# Patient Record
Sex: Male | Born: 1995 | Race: White | Hispanic: No | Marital: Single | Smoking: Never smoker
Health system: Southern US, Community
[De-identification: ages and names within clinical notes are randomized; demographics above are authoritative.]

## PROBLEM LIST (undated history)

## (undated) DIAGNOSIS — F419 Anxiety disorder, unspecified: Secondary | ICD-10-CM

## (undated) DIAGNOSIS — F840 Autistic disorder: Secondary | ICD-10-CM

---
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
## 2018-04-05 ENCOUNTER — Other Ambulatory Visit: Payer: Self-pay | Admitting: Internal Medicine

## 2018-04-05 DIAGNOSIS — R945 Abnormal results of liver function studies: Principal | ICD-10-CM

## 2018-04-05 DIAGNOSIS — R7989 Other specified abnormal findings of blood chemistry: Secondary | ICD-10-CM

## 2018-04-16 ENCOUNTER — Ambulatory Visit
Admission: RE | Admit: 2018-04-16 | Discharge: 2018-04-16 | Disposition: A | Discharge status: Home / Self Care | Payer: BLUE CROSS/BLUE SHIELD | Source: Ambulatory Visit | Attending: Internal Medicine | Admitting: Internal Medicine

## 2018-04-16 DIAGNOSIS — R7989 Other specified abnormal findings of blood chemistry: Secondary | ICD-10-CM

## 2018-04-16 DIAGNOSIS — R945 Abnormal results of liver function studies: Principal | ICD-10-CM

## 2018-09-13 ENCOUNTER — Other Ambulatory Visit: Payer: Self-pay | Admitting: Internal Medicine

## 2018-09-13 DIAGNOSIS — R7401 Elevation of levels of liver transaminase levels: Secondary | ICD-10-CM

## 2018-10-10 ENCOUNTER — Other Ambulatory Visit: Payer: BLUE CROSS/BLUE SHIELD

## 2018-10-17 ENCOUNTER — Ambulatory Visit
Admission: RE | Admit: 2018-10-17 | Discharge: 2018-10-17 | Disposition: A | Discharge status: Home / Self Care | Payer: BC Managed Care – PPO | Source: Ambulatory Visit | Attending: Internal Medicine | Admitting: Internal Medicine

## 2018-10-17 DIAGNOSIS — R7401 Elevation of levels of liver transaminase levels: Secondary | ICD-10-CM

## 2019-05-10 IMAGING — US ULTRASOUND ABDOMEN LIMITED
1 series · 14 of 25 positions shown · non-contrast
Comparison: None.

CLINICAL DATA: Elevated liver function tests.

EXAM:
ULTRASOUND ABDOMEN LIMITED RIGHT UPPER QUADRANT

[Series 1: ultrasound abdomen limited · 0.28mm/px · 14 of 43 slices shown]
[im 1/43]
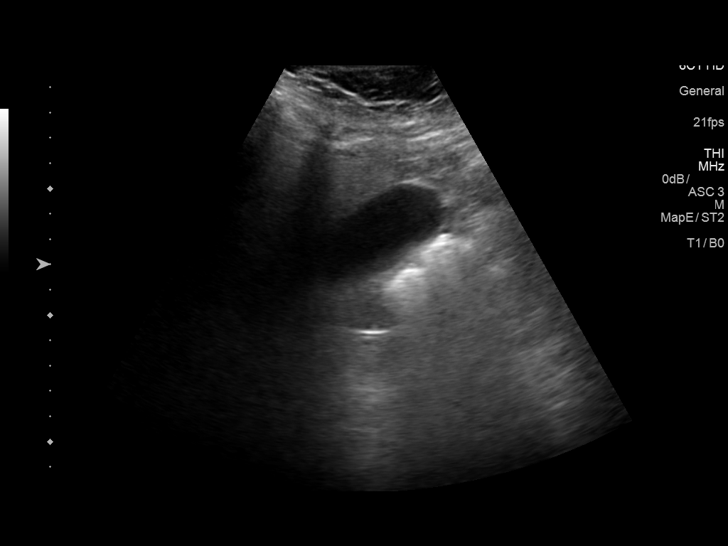
[im 4/43]
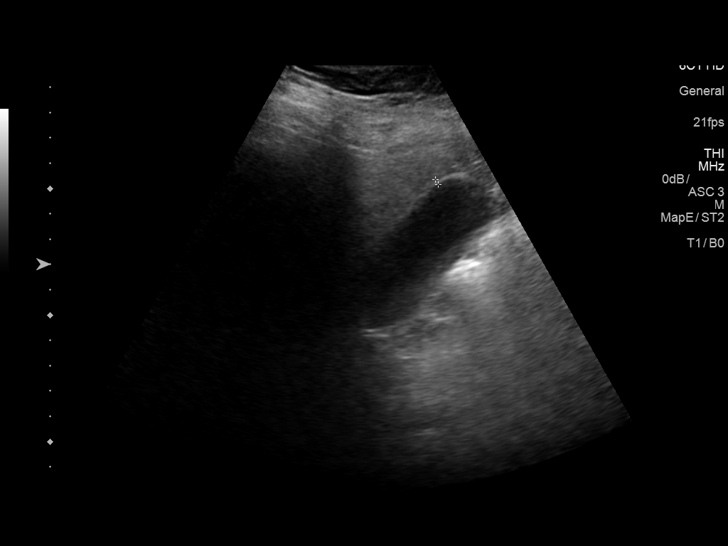
[im 8/43]
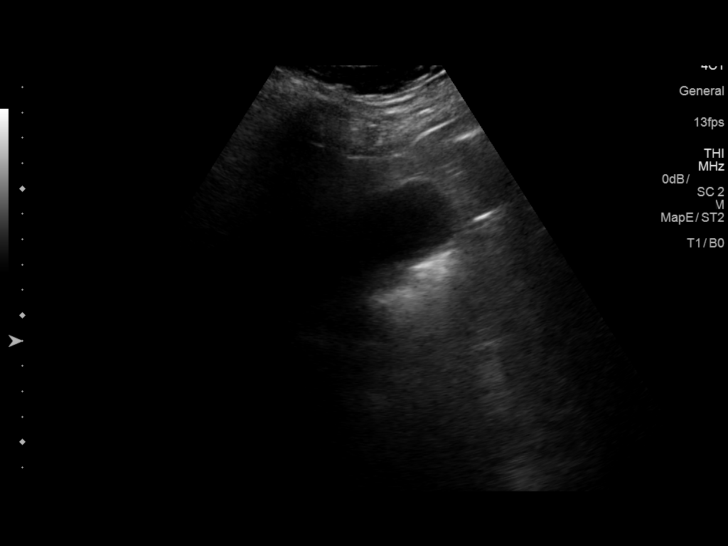
[im 11/43]
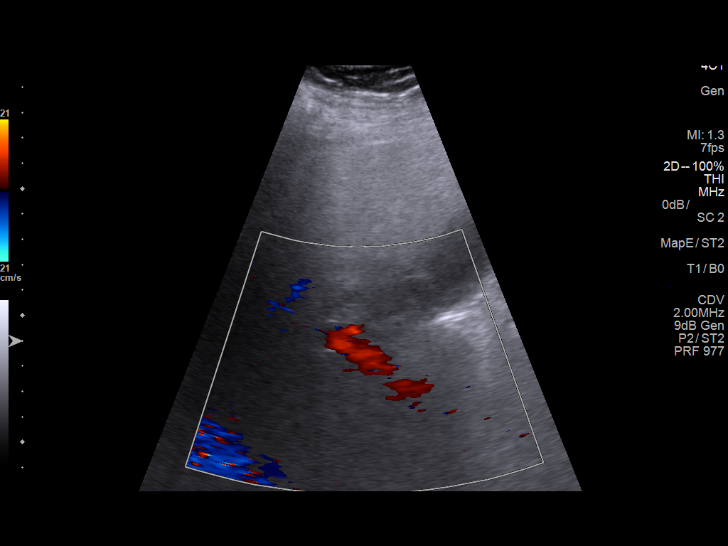
[im 15/43]
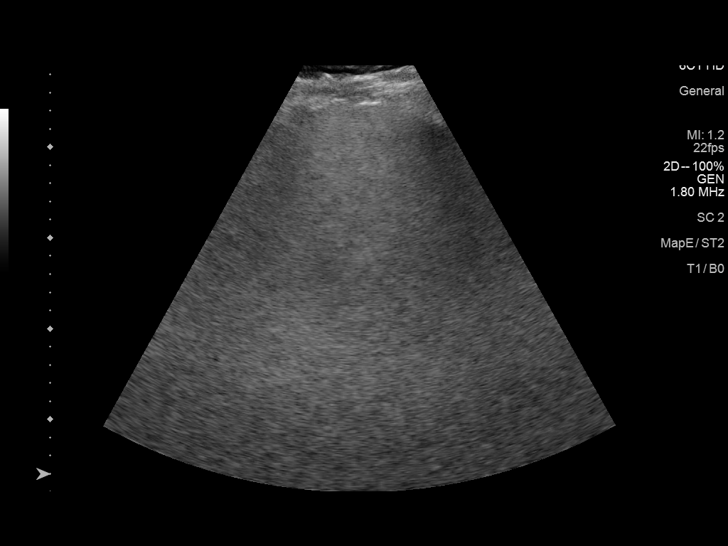
[im 16/43]
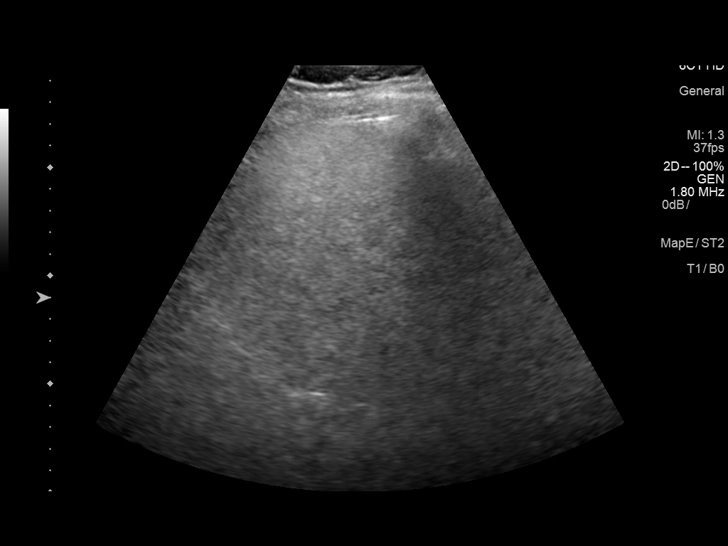
[im 20/43]
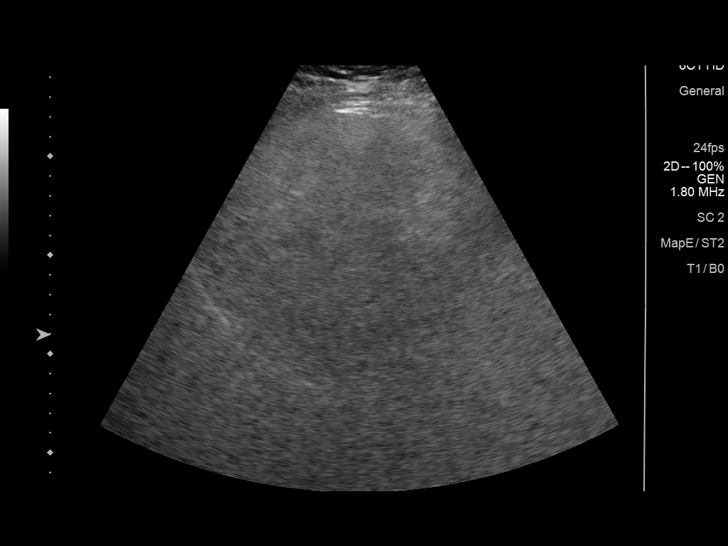
[im 23/43]
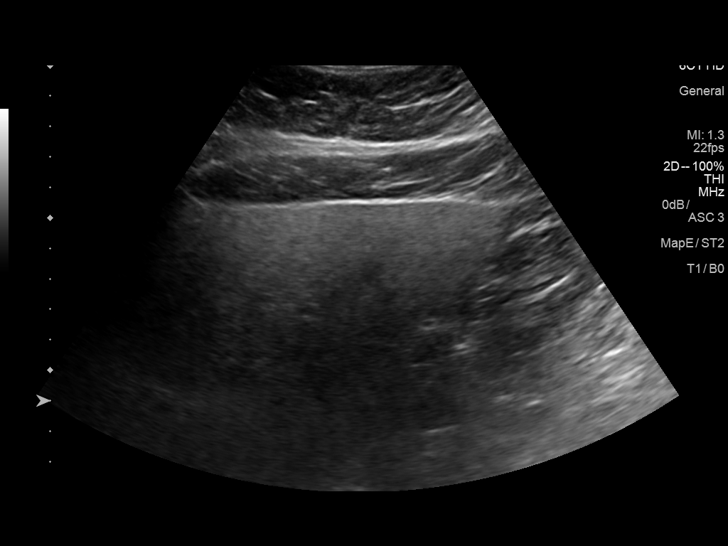
[im 27/43]
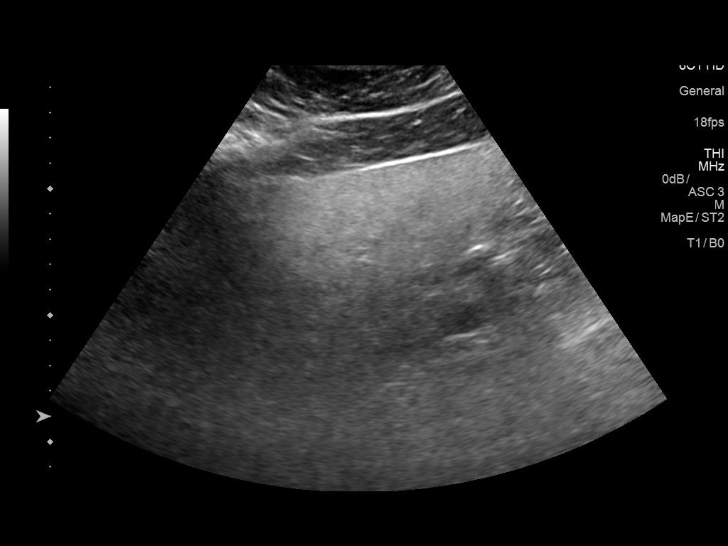
[im 29/43]
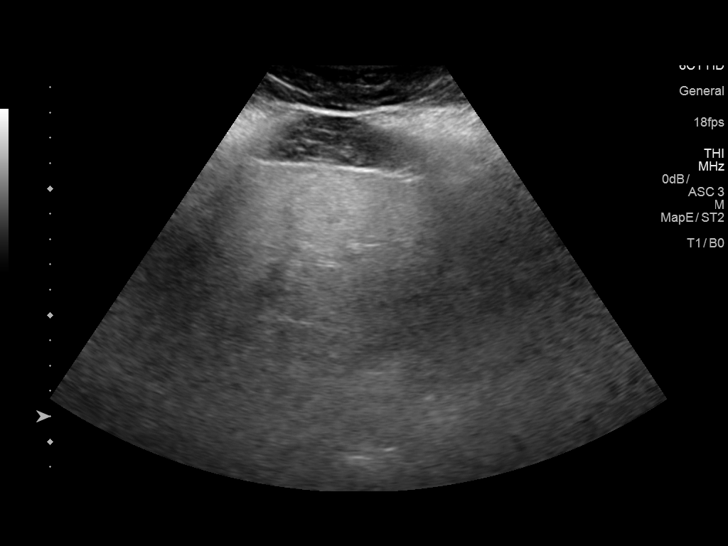
[im 32/43]
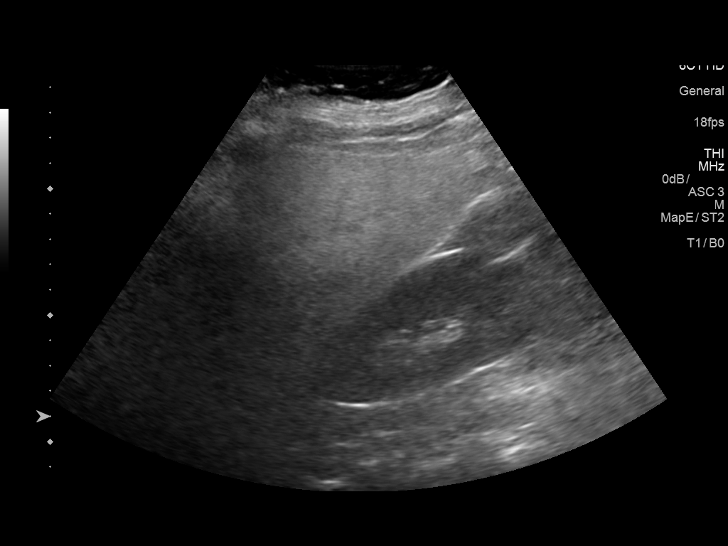
[im 36/43]
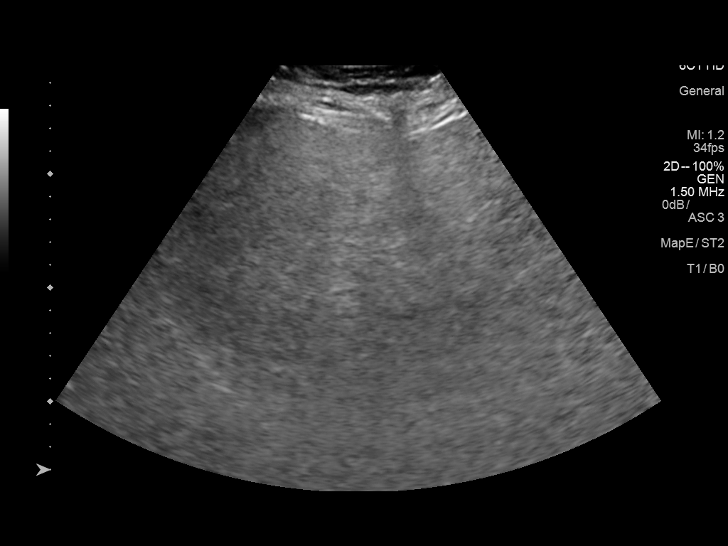
[im 39/43]
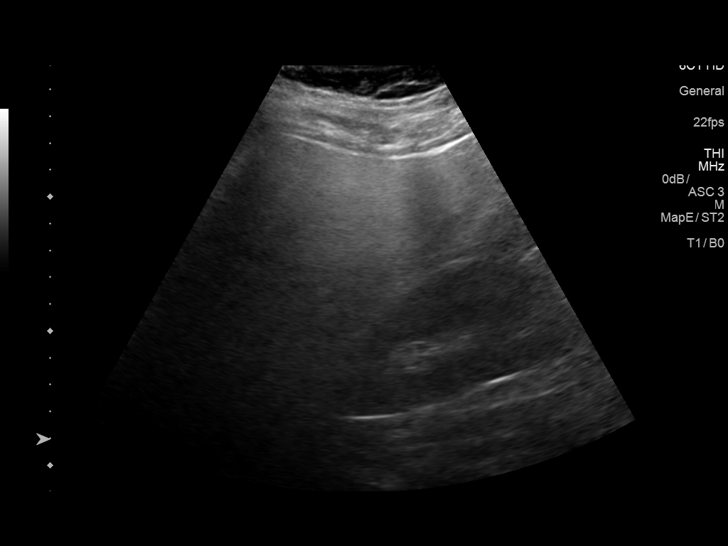
[im 43/43]
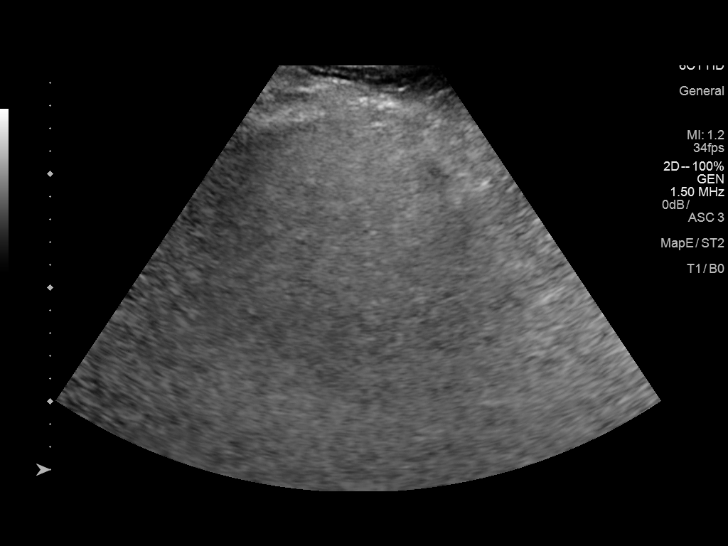

[14 of 25 positions shown; findings below may reference images not displayed]

FINDINGS: Gallbladder:

No gallstones or wall thickening visualized. No sonographic Murphy
sign noted by sonographer.

Common bile duct:

Diameter: 3.4 mm.  Normal.

Liver:

Diffusely echogenic suggesting diffuse fatty change. No focal
lesion. No ductal dilatation. Portal veins patent with proper
directional flow.
IMPRESSION: Diffusely echogenic liver consistent with advanced steatosis. No
sign of focal lesion or ductal dilatation. No visible gallbladder
disease.

## 2019-05-23 ENCOUNTER — Ambulatory Visit: Payer: 59 | Attending: Internal Medicine

## 2019-05-23 DIAGNOSIS — Z23 Encounter for immunization: Secondary | ICD-10-CM

## 2019-05-23 NOTE — Progress Notes (Signed)
   Covid-19 Vaccination Clinic  Name:  CHAE OOMMEN    MRN: [id number] DOB: Jun 02, 1995  05/23/2019  Mr. Bristol was observed post Covid-19 immunization for 15 minutes without incident. He was provided with Vaccine Information Sheet and instruction to access the V-Safe system.   Mr. Westwood was instructed to call 911 with any severe reactions post vaccine: Marland Kitchen Difficulty breathing  . Swelling of face and throat  . A fast heartbeat  . A bad rash all over body  . Dizziness and weakness   Immunizations Administered    Name Date Dose VIS Date Route   Pfizer COVID-19 Vaccine 05/23/2019  3:38 PM 0.3 mL 03/27/2018 Intramuscular   Manufacturer: [employer], [employer]   Lot: [id number]   NDC: [address]-2

## 2019-06-12 ENCOUNTER — Other Ambulatory Visit (HOSPITAL_COMMUNITY): Payer: Self-pay | Admitting: Internal Medicine

## 2019-06-12 DIAGNOSIS — R748 Abnormal levels of other serum enzymes: Secondary | ICD-10-CM

## 2019-06-17 ENCOUNTER — Ambulatory Visit: Payer: 59 | Attending: Internal Medicine

## 2019-06-17 DIAGNOSIS — Z23 Encounter for immunization: Secondary | ICD-10-CM

## 2019-06-17 NOTE — Progress Notes (Signed)
   Covid-19 Vaccination Clinic  Name:  Eugene Perry    MRN: [id number] DOB: 1995/03/06  06/17/2019  Mr. [name] was observed post Covid-19 immunization for 15 minutes without incident. He was provided with Vaccine Information Sheet and instruction to access the V-Safe system.   Mr. [name] was instructed to call 911 with any severe reactions post vaccine: Marland Kitchen Difficulty breathing  . Swelling of face and throat  . A fast heartbeat  . A bad rash all over body  . Dizziness and weakness   Immunizations Administered    Name Date Dose VIS Date Route   Pfizer COVID-19 Vaccine 06/17/2019  3:17 PM 0.3 mL 03/27/2018 Intramuscular   Manufacturer: [employer], [employer]   Lot: [id number]   NDC: [address]-3      Covid-19 Vaccination Clinic  Name:  Eugene Perry    MRN: [id number] DOB: 1996/01/07  06/17/2019  Mr. [name] was observed post Covid-19 immunization for 15 minutes without incident. He was provided with Vaccine Information Sheet and instruction to access the V-Safe system.   Mr. [name] was instructed to call 911 with any severe reactions post vaccine: Marland Kitchen Difficulty breathing  . Swelling of face and throat  . A fast heartbeat  . A bad rash all over body  . Dizziness and weakness   Immunizations Administered    Name Date Dose VIS Date Route   Pfizer COVID-19 Vaccine 06/17/2019  3:17 PM 0.3 mL 03/27/2018 Intramuscular   Manufacturer: [employer], [employer]   Lot: [id number]   NDC: [address]-5      Covid-19 Vaccination Clinic  Name:  Eugene Perry    MRN: [id number] DOB: 07/01/1995  06/17/2019  Mr. [name] was observed post Covid-19 immunization for 15 minutes without incident. He was provided with Vaccine Information Sheet and instruction to access the V-Safe system.   Mr. [name] was instructed to call 911 with any severe reactions post vaccine: Marland Kitchen Difficulty breathing  . Swelling of face and throat  . A fast heartbeat  . A bad rash all over body  . Dizziness and weakness   Immunizations Administered     Name Date Dose VIS Date Route   Pfizer COVID-19 Vaccine 06/17/2019  3:17 PM 0.3 mL 03/27/2018 Intramuscular   Manufacturer: [employer], [employer]   Lot: [id number]   NDC: [address]-1

## 2019-07-16 ENCOUNTER — Other Ambulatory Visit: Payer: Self-pay | Admitting: Student

## 2019-07-17 ENCOUNTER — Encounter (HOSPITAL_COMMUNITY): Payer: Self-pay

## 2019-07-17 ENCOUNTER — Ambulatory Visit (HOSPITAL_COMMUNITY)
Admission: RE | Admit: 2019-07-17 | Discharge: 2019-07-17 | Disposition: A | Discharge status: Home / Self Care | Payer: 59 | Source: Ambulatory Visit | Attending: Internal Medicine | Admitting: Internal Medicine

## 2019-07-17 ENCOUNTER — Other Ambulatory Visit: Payer: Self-pay | Admitting: Physician Assistant

## 2019-07-17 ENCOUNTER — Other Ambulatory Visit: Payer: Self-pay

## 2019-07-17 DIAGNOSIS — F840 Autistic disorder: Secondary | ICD-10-CM | POA: Insufficient documentation

## 2019-07-17 DIAGNOSIS — K7581 Nonalcoholic steatohepatitis (NASH): Secondary | ICD-10-CM | POA: Diagnosis not present

## 2019-07-17 DIAGNOSIS — F419 Anxiety disorder, unspecified: Secondary | ICD-10-CM | POA: Insufficient documentation

## 2019-07-17 DIAGNOSIS — R748 Abnormal levels of other serum enzymes: Secondary | ICD-10-CM | POA: Diagnosis present

## 2019-07-17 LAB — CBC
HCT: 51.4 % (ref 39.0–52.0)
Hemoglobin: 16.7 g/dL (ref 13.0–17.0)
MCH: 28.9 pg (ref 26.0–34.0)
MCHC: 32.5 g/dL (ref 30.0–36.0)
MCV: 88.9 fL (ref 80.0–100.0)
Platelets: 420 10*3/uL — ABNORMAL HIGH (ref 150–400)
RBC: 5.78 MIL/uL (ref 4.22–5.81)
RDW: 13.2 % (ref 11.5–15.5)
WBC: 7.9 10*3/uL (ref 4.0–10.5)
nRBC: 0 % (ref 0.0–0.2)

## 2019-07-17 LAB — PROTIME-INR
INR: 1 (ref 0.8–1.2)
Prothrombin Time: 13 seconds (ref 11.4–15.2)

## 2019-07-17 MED ORDER — SODIUM CHLORIDE 0.9 % IV SOLN
INTRAVENOUS | Status: AC | PRN
  Administered 2019-07-17: 10 mL/h via INTRAVENOUS

## 2019-07-17 MED ORDER — FENTANYL CITRATE (PF) 100 MCG/2ML IJ SOLN
INTRAMUSCULAR | Status: AC
  Filled 2019-07-17: qty 2

## 2019-07-17 MED ORDER — FENTANYL CITRATE (PF) 100 MCG/2ML IJ SOLN
INTRAMUSCULAR | Status: AC | PRN
  Administered 2019-07-17: 25 ug via INTRAVENOUS
  Administered 2019-07-17: 50 ug via INTRAVENOUS

## 2019-07-17 MED ORDER — LIDOCAINE-EPINEPHRINE 1 %-1:100000 IJ SOLN
INTRAMUSCULAR | Status: AC
  Filled 2019-07-17: qty 1

## 2019-07-17 MED ORDER — MIDAZOLAM HCL 2 MG/2ML IJ SOLN
INTRAMUSCULAR | Status: AC
  Filled 2019-07-17: qty 2

## 2019-07-17 MED ORDER — MIDAZOLAM HCL 2 MG/2ML IJ SOLN
INTRAMUSCULAR | Status: AC | PRN
  Administered 2019-07-17: 0.5 mg via INTRAVENOUS
  Administered 2019-07-17: 1 mg via INTRAVENOUS

## 2019-07-17 MED ORDER — GELATIN ABSORBABLE 12-7 MM EX MISC
CUTANEOUS | Status: AC
  Filled 2019-07-17: qty 1

## 2019-07-17 MED ORDER — SODIUM CHLORIDE 0.9 % IV SOLN: INTRAVENOUS | Status: DC

## 2019-07-17 NOTE — Discharge Instructions (Addendum)
Liver Biopsy, Care After These instructions give you information about how to care for yourself after your procedure. Your health care provider may also give you more specific instructions. If you have problems or questions, contact your health care provider. What can I expect after the procedure? After your procedure, it is common to have:  Pain and soreness in the area where the biopsy was done.  Bruising around the area where the biopsy was done.  Sleepiness and fatigue for 1-2 days. Follow these instructions at home: Medicines  Take over-the-counter and prescription medicines only as told by your health care provider.  If you were prescribed an antibiotic medicine, take it as told by your health care provider. Do not stop taking the antibiotic even if you start to feel better.  Do not take medicines such as aspirin and ibuprofen unless your health care provider tells you to take them. These medicines thin your blood and can increase the risk of bleeding.  If you are taking prescription pain medicine, take actions to prevent or treat constipation. Your health care provider may recommend that you: ? Drink enough fluid to keep your urine pale yellow. ? Eat foods that are high in fiber, such as fresh fruits and vegetables, whole grains, and beans. ? Limit foods that are high in fat and processed sugars, such as fried or sweet foods. ? Take an over-the-counter or prescription medicine for constipation. Incision care  Follow instructions from your health care provider about how to take care of your incision. Make sure you: ? Wash your hands with soap and water before you change your bandage (dressing). If soap and water are not available, use hand sanitizer. ? Change your dressing as told by your health care provider. ? Leave stitches (sutures), skin glue, or adhesive strips in place. These skin closures may need to stay in place for 2 weeks or longer. If adhesive strip edges start to  loosen and curl up, you may trim the loose edges. Do not remove adhesive strips completely unless your health care provider tells you to do that.  Check your incision area every day for signs of infection. Check for: ? Redness, swelling, or pain. ? Fluid or blood. ? Warmth. ? Pus or a bad smell.  Do not take baths, swim, or use a hot tub until your health care provider says it is okay to do so. Activity   Rest at home for 1-2 days, or as directed by your health care provider. ? Avoid sitting for a long time without moving. Get up to take short walks every 1-2 hours. This is important to improve blood flow and breathing. Ask for help if you feel weak or unsteady.  Return to your normal activities as told by your health care provider. Ask your health care provider what activities are safe for you.  Do not drive or use heavy machinery while taking prescription pain medicine.  Do not lift anything that is heavier than 10 lb (4.5 kg), or the limit that your health care provider tells you, until he or she says that it is safe.  Do not play contact sports for 2 weeks after the procedure. General instructions   Do not drink alcohol in the first week after the procedure.  Have someone stay with you for at least 24 hours after the procedure.  It is your responsibility to obtain your test results. Ask your health care provider, or the department that is doing the test: ? When will my   results be ready? ? How will I get my results? ? What are my treatment options? ? What other tests do I need? ? What are my next steps?  Keep all follow-up visits as told by your health care provider. This is important. Contact a health care provider if:  You have increased bleeding from an incision, resulting in more than a small spot of blood.  You have redness, swelling, or increasing pain in any incisions.  You notice a discharge or a bad smell coming from any of your incisions.  You have a fever or  chills. Get help right away if:  You develop swelling, bloating, or pain in your abdomen.  You become dizzy or faint.  You develop a rash.  You have nausea or you vomit.  You faint, or you have shortness of breath or difficulty breathing.  You develop chest pain.  You have problems with your speech or vision.  You have trouble with your balance or moving your arms or legs. Summary  After the liver biopsy, it is common to have pain, soreness, and bruising in the area, as well as sleepiness and fatigue.  Take over-the-counter and prescription medicines only as told by your health care provider.  Follow instructions from your health care provider about how to care for your incision. Check the incision area daily for signs of infection. This information is not intended to replace advice given to you by your health care provider. Make sure you discuss any questions you have with your health care provider. Document Revised: 03/12/2018 Document Reviewed: 01/27/2017 Elsevier Patient Education  2020 [employer]. Moderate Conscious Sedation, Adult Sedation is the use of medicines to promote relaxation and relieve discomfort and anxiety. Moderate conscious sedation is a type of sedation. Under moderate conscious sedation, you are less alert than normal, but you are still able to respond to instructions, touch, or both. Moderate conscious sedation is used during short medical and dental procedures. It is milder than deep sedation, which is a type of sedation under which you cannot be easily woken up. It is also milder than general anesthesia, which is the use of medicines to make you unconscious. Moderate conscious sedation allows you to return to your regular activities sooner. Tell a health care provider about:  Any allergies you have.  All medicines you are taking, including vitamins, herbs, eye drops, creams, and over-the-counter medicines.  Use of steroids (by mouth or creams).  Any  problems you or family members have had with sedatives and anesthetic medicines.  Any blood disorders you have.  Any surgeries you have had.  Any medical conditions you have, such as sleep apnea.  Whether you are pregnant or may be pregnant.  Any use of cigarettes, alcohol, marijuana, or street drugs. What are the risks? Generally, this is a safe procedure. However, problems may occur, including:  Getting too much medicine (oversedation).  Nausea.  Allergic reaction to medicines.  Trouble breathing. If this happens, a breathing tube may be used to help with breathing. It will be removed when you are awake and breathing on your own.  Heart trouble.  Lung trouble. What happens before the procedure? Staying hydrated Follow instructions from your health care provider about hydration, which may include:  Up to 2 hours before the procedure - you may continue to drink clear liquids, such as water, clear fruit juice, black coffee, and plain tea. Eating and drinking restrictions Follow instructions from your health care provider about eating and drinking, which   may include:  8 hours before the procedure - stop eating heavy meals or foods such as meat, fried foods, or fatty foods.  6 hours before the procedure - stop eating light meals or foods, such as toast or cereal.  6 hours before the procedure - stop drinking milk or drinks that contain milk.  2 hours before the procedure - stop drinking clear liquids. Medicine Ask your health care provider about:  Changing or stopping your regular medicines. This is especially important if you are taking diabetes medicines or blood thinners.  Taking medicines such as aspirin and ibuprofen. These medicines can thin your blood. Do not take these medicines before your procedure if your health care provider instructs you not to.  Tests and exams  You will have a physical exam.  You may have blood tests done to show: ? How well your  kidneys and liver are working. ? How well your blood can clot. General instructions  Plan to have someone take you home from the hospital or clinic.  If you will be going home right after the procedure, plan to have someone with you for 24 hours. What happens during the procedure?  An IV tube will be inserted into one of your veins.  Medicine to help you relax (sedative) will be given through the IV tube.  The medical or dental procedure will be performed. What happens after the procedure?  Your blood pressure, heart rate, breathing rate, and blood oxygen level will be monitored often until the medicines you were given have worn off.  Do not drive for 24 hours. This information is not intended to replace advice given to you by your health care provider. Make sure you discuss any questions you have with your health care provider. Document Revised: 12/30/2016 Document Reviewed: 05/09/2015 Elsevier Patient Education  2020 Elsevier Inc.  

## 2019-07-17 NOTE — Procedures (Signed)
Pre Procedure Dx: Elevated LFTs of uncertain etiology Post Procedural Dx: Same  Technically successful US guided biopsy of right lobe of the liver.  EBL: None No immediate complications.   Jay Lerry Cordrey, MD Pager #: [phone]    

## 2019-07-17 NOTE — Progress Notes (Signed)
Discharge instructions reviewed with pt and his mom both voice understanding.  

## 2019-07-17 NOTE — H&P (Addendum)
Chief Complaint: Patient was seen in consultation today for liver core biopsy at the request of PA Ollis,Courtney  Referring Physician(s): Ollis,Courtney PA  Supervising Physician: Sandi Mariscal  Patient Status: [facility] - Out-pt  History of Present Illness: Eugene Perry is a 24 y.o. male   Mildly autistic male Anxiety Was seen by PMD for annual PE and was found to have elevated Liver functions Referred to [facility]-- Gastrenterology  [place] 10/17/18: IMPRESSION: ULTRASOUND ABDOMEN: No evidence of cholelithiasis or biliary ductal dilatation. Diffuse hepatocellular disease.  ULTRASOUND HEPATIC ELASTOGRAHY: Median hepatic shear wave velocity is calculated at 3.08 m/sec. Corresponding Metavir fibrosis score is Some F3 + F4. Risk of fibrosis is High.  Labs continue to be high  Requesting random liver biopsy    Past Medical History:  Diagnosis Date  . Anxiety   . Autism     History reviewed. No pertinent surgical history.  Allergies: Latex  Medications: Prior to Admission medications   Medication Sig Start Date End Date Taking? Authorizing Provider  acetaminophen (TYLENOL) 160 MG/5ML suspension Take 960 mg by mouth every 6 (six) hours as needed for mild pain or fever. 30 ml    Yes [provider]  ALPRAZolam (XANAX) 0.5 MG tablet Take 0.25 mg by mouth daily as needed for anxiety.    Yes [provider]  cetirizine HCl (ZYRTEC) 5 MG/5ML SOLN Take 10 mg by mouth daily.   Yes [provider]  flintstones complete (FLINTSTONES) 60 MG chewable tablet Chew 1 tablet by mouth at bedtime.    Yes [provider]  FLUoxetine (PROZAC) 20 MG/5ML solution Take 80 mg by mouth at bedtime. 4 tsp   Yes [provider]  ketoconazole (NIZORAL) 2 % shampoo Apply 1 application topically once a week.   Yes [provider]  Melatonin 5 MG CHEW Chew 5 mg by mouth at bedtime. Sublingual    Yes [provider]  pseudoephedrine  (SUDAFED) 30 MG/5ML syrup Take 120 mg by mouth daily as needed for congestion. 20 ml     [provider]     History reviewed. No pertinent family history.  Social History   Socioeconomic History  . Marital status: Single    Spouse name: Not on file  . Number of children: Not on file  . Years of education: Not on file  . Highest education level: Not on file  Occupational History  . Not on file  Tobacco Use  . Smoking status: Not on file  Substance and Sexual Activity  . Alcohol use: No  . Drug use: No  . Sexual activity: Not on file  Other Topics Concern  . Not on file  Social History Narrative  . Not on file   Social Determinants of Health   Financial Resource Strain:   . Difficulty of Paying Living Expenses:   Food Insecurity:   . Worried About Charity fundraiser in the Last Year:   . Arboriculturist in the Last Year:   Transportation Needs:   . Film/video editor (Medical):   Marland Kitchen Lack of Transportation (Non-Medical):   Physical Activity:   . Days of Exercise per Week:   . Minutes of Exercise per Session:   Stress:   . Feeling of Stress :   Social Connections:   . Frequency of Communication with Friends and Family:   . Frequency of Social Gatherings with Friends and Family:   . Attends Religious Services:   . Active  Member of Clubs or Organizations:   . Attends Banker Meetings:   Marland Kitchen Marital Status:      Review of Systems: A 12 point ROS discussed and pertinent positives are indicated in the HPI above.  All other systems are negative.  Review of Systems  Constitutional: Negative for activity change, fatigue and fever.  Respiratory: Negative for cough and shortness of breath.   Gastrointestinal: Negative for abdominal pain and nausea.  Neurological: Negative for weakness.  Psychiatric/Behavioral: Negative for behavioral problems and confusion. The patient is nervous/anxious.     Vital Signs: Ht 5\' 10"  (1.778 m)   Wt 256 lb (116.1  kg)   BMI 36.73 kg/m   Physical Exam Vitals reviewed.  Cardiovascular:     Rate and Rhythm: Normal rate and regular rhythm.     Heart sounds: Normal heart sounds.  Pulmonary:     Effort: Pulmonary effort is normal.     Breath sounds: Normal breath sounds.  Abdominal:     Palpations: Abdomen is soft.     Tenderness: There is no abdominal tenderness.  Musculoskeletal:        General: Normal range of motion.  Skin:    General: Skin is warm.  Neurological:     Mental Status: He is alert and oriented to person, place, and time.  Psychiatric:        Behavior: Behavior normal.     Imaging: No results found.  Labs:  CBC: No results for input(s): WBC, HGB, HCT, PLT in the last 8760 hours.  COAGS: No results for input(s): INR, APTT in the last 8760 hours.  BMP: No results for input(s): NA, K, CL, CO2, GLUCOSE, BUN, CALCIUM, CREATININE, GFRNONAA, GFRAA in the last 8760 hours.  Invalid input(s): CMP  LIVER FUNCTION TESTS: No results for input(s): BILITOT, AST, ALT, ALKPHOS, PROT, ALBUMIN in the last 8760 hours.  TUMOR MARKERS: No results for input(s): AFPTM, CEA, CA199, CHROMGRNA in the last 8760 hours.  Assessment and Plan:  Persistent elevated LFTs Elastography reveals high risk for fibrosis Scheduled for liver core biopsy Risks and benefits of liver core biopsy was discussed with the patient and/or patient's family including, but not limited to bleeding, infection, damage to adjacent structures or low yield requiring additional tests.  All of the questions were answered and there is agreement to proceed. Consent signed and in chart.   Thank you for this interesting consult.  I greatly enjoyed meeting GIOVAN PINSKY and look forward to participating in their care.  A copy of this report was sent to the requesting provider on this date.  Electronically Signed: Renaldo Harrison, PA-C 07/17/2019, 11:59 AM   I spent a total of  30 Minutes   in face to face in  clinical consultation, greater than 50% of which was counseling/coordinating care for liver core biopsy

## 2019-07-18 LAB — SURGICAL PATHOLOGY

## 2021-06-08 IMAGING — US US ABDOMEN LIMITED W/ ELASTOGRAPHY
2 series · 13 of 25 positions shown · non-contrast
Comparison: Right upper quadrant ultrasound on 04/16/2018

CLINICAL DATA: Transaminitis.

EXAM:
US ABDOMEN LIMITED - RIGHT UPPER QUADRANT
ULTRASOUND HEPATIC ELASTOGRAPHY
TECHNIQUE: Limited right upper quadrant abdominal ultrasound was performed. In
addition, ultrasound elastography evaluation of the liver was
performed. A region of interest was placed in the right lobe of the
liver. Following application of a compressive sonographic pulse,
shear waves were detected in the adjacent hepatic tissue and the
shear wave velocity was calculated. Multiple assessments were
performed at the selected site. Median shear wave velocity is
correlated to a Metavir fibrosis score.

[Series 1: us abdomen limited w/ elastography · 0.19mm/px · 7 of 58 slices shown (1 of 2)]
[im 1/58]
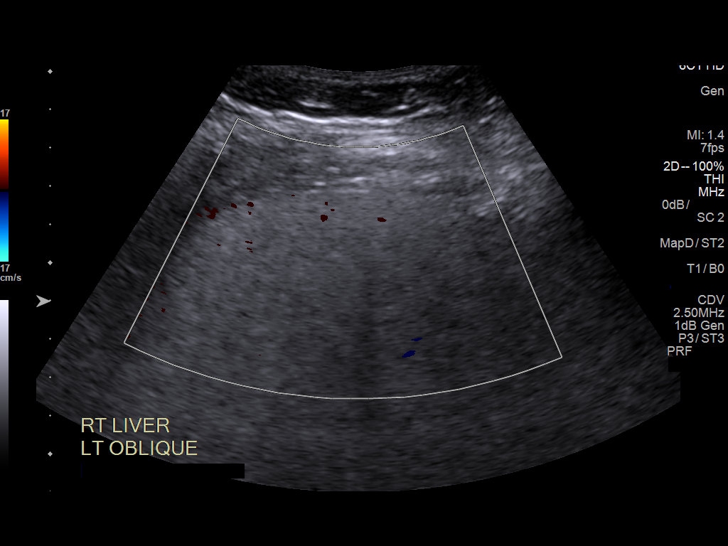
[im 9/58]
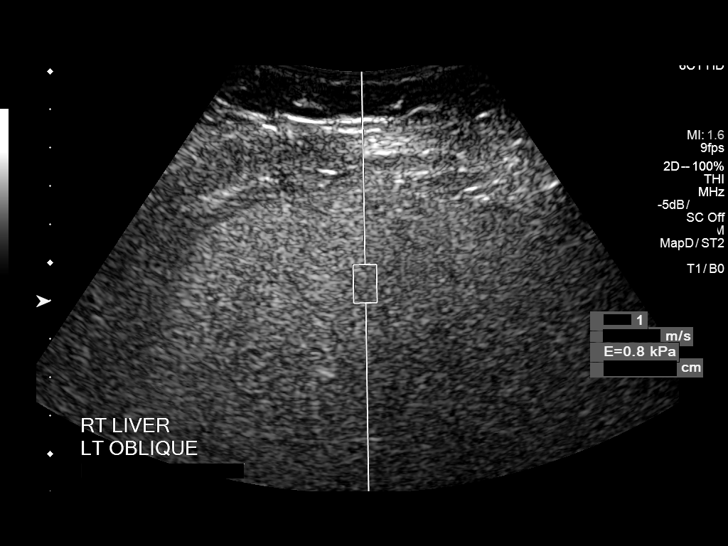
[im 18/58]
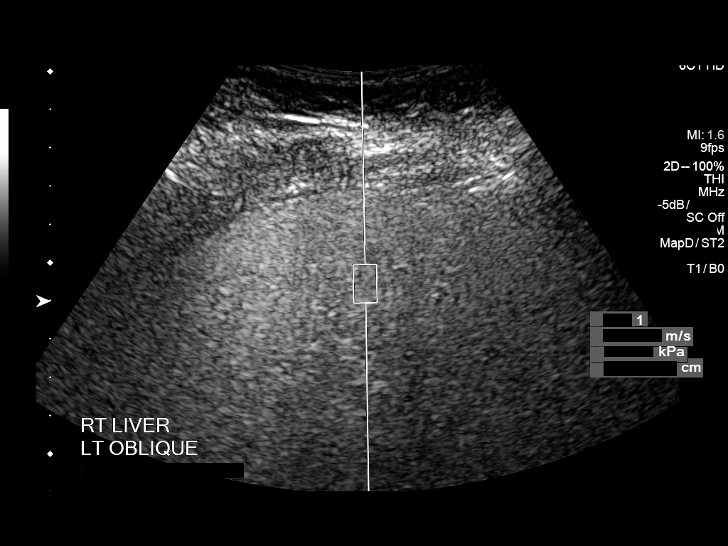
[im 27/58]
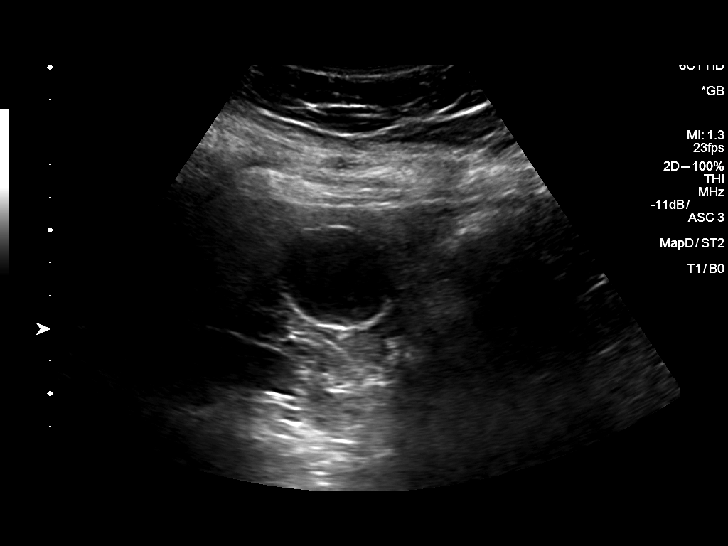
[im 36/58]
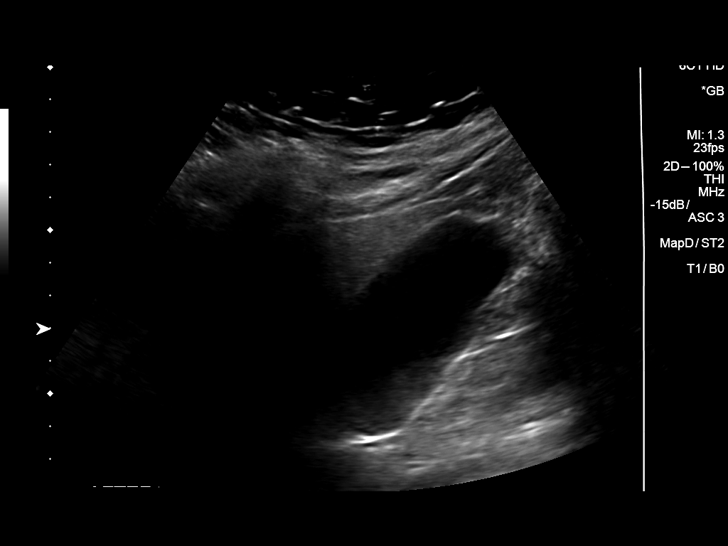
[im 44/58]
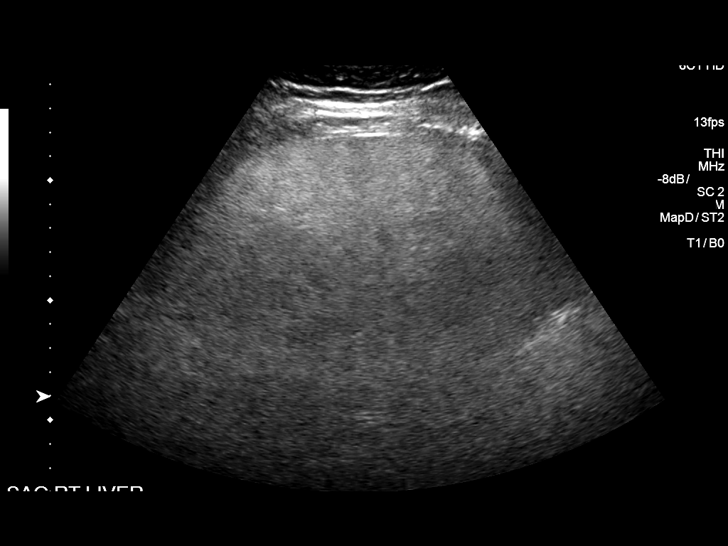
[im 53/58]
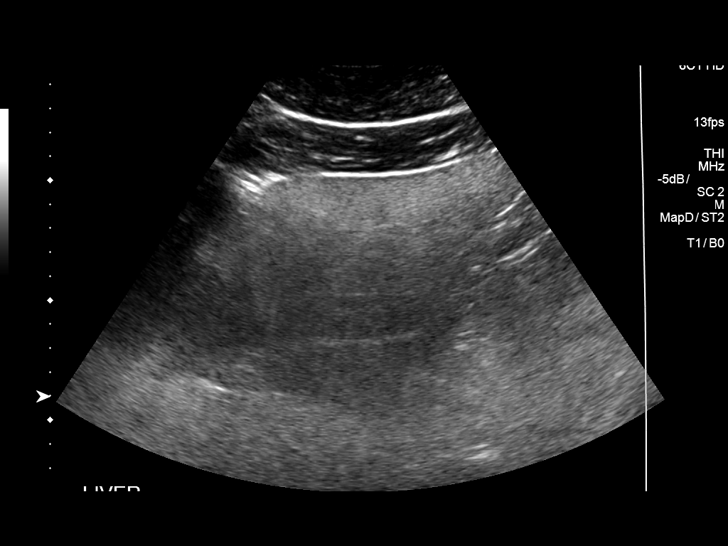

[Series 1: us abdomen limited w/ elastography · 0.19mm/px · 6 of 48 slices shown (2 of 2)]
[im 1/48]
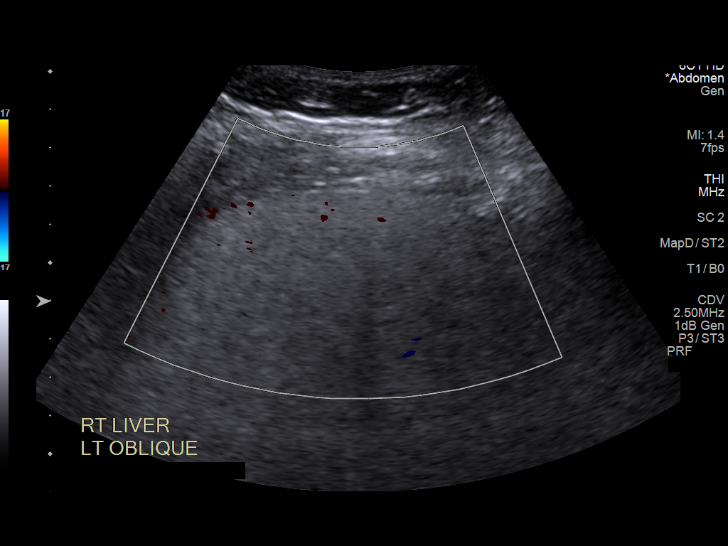
[im 10/48]
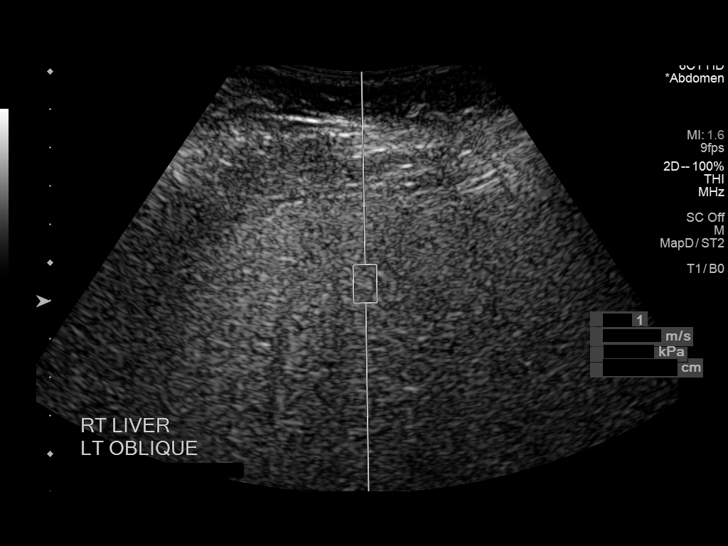
[im 19/48]
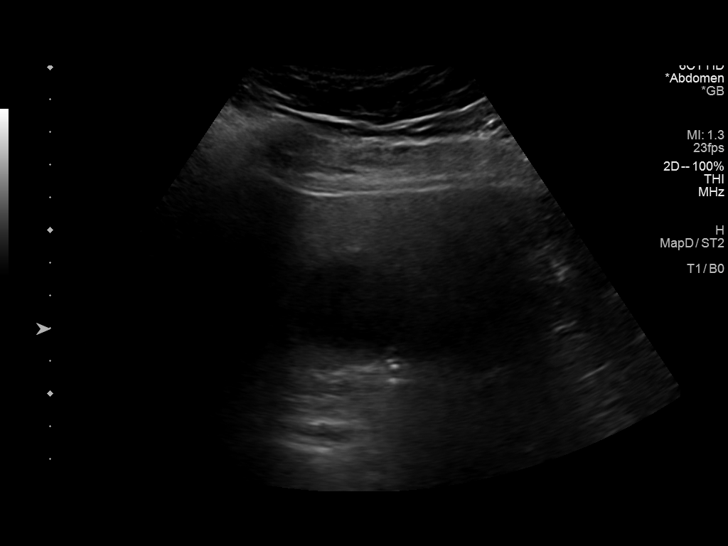
[im 29/48]
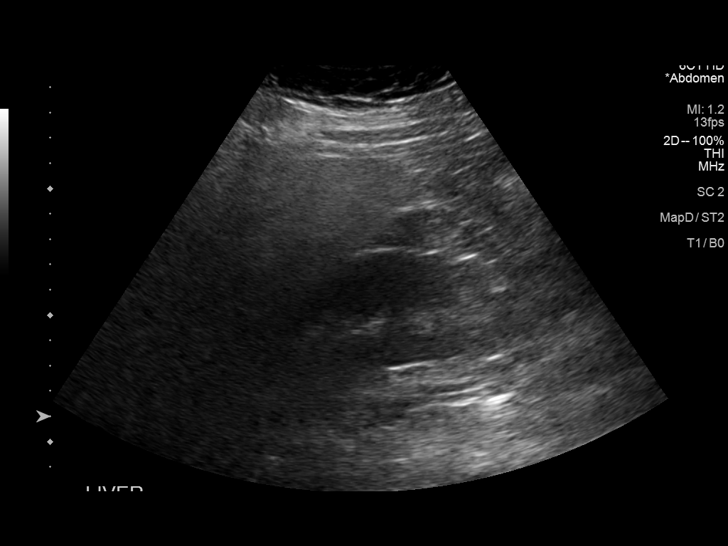
[im 38/48]
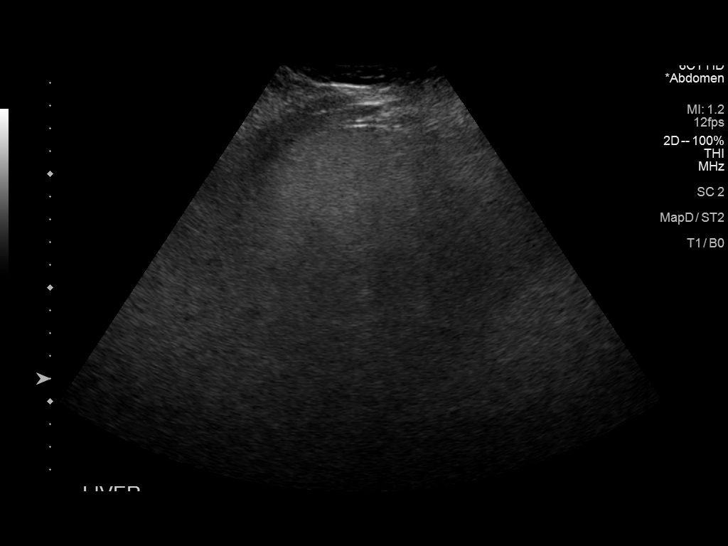
[im 48/48]
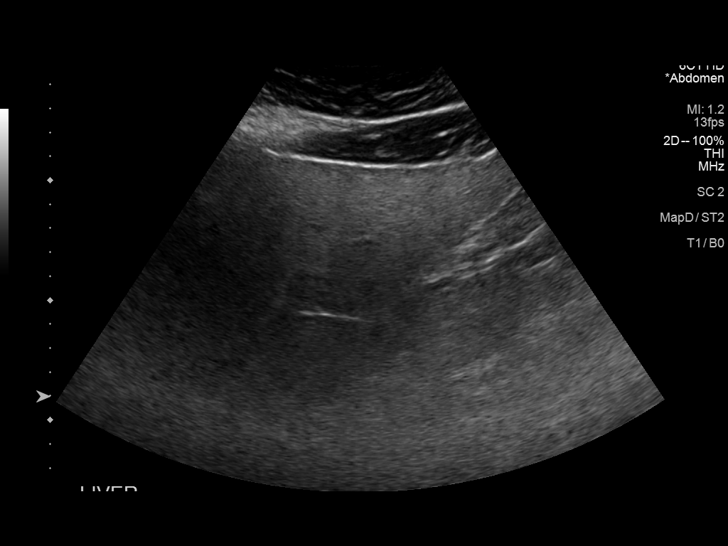

[13 of 25 positions shown; findings below may reference images not displayed]

FINDINGS: ULTRASOUND ABDOMEN LIMITED RIGHT UPPER QUADRANT

Gallbladder:

No gallstones or wall thickening visualized. No sonographic Murphy
sign noted.

Common bile duct:

Diameter: 4 mm, within normal limits.

Liver:

Diffusely increased and coarse echogenicity of the hepatic
parenchyma, consistent with diffuse hepatocellular disease. No
hepatic mass identified. Portal vein is patent on color Doppler
imaging with normal direction of blood flow towards the liver.

ULTRASOUND HEPATIC ELASTOGRAPHY

Device: Siemens Helix VTQ

Patient position: Oblique

Transducer [id number]

Number of measurements: 10

Hepatic segment:  8

Median velocity:   3.08 m/sec

IQR:

IQR/Median velocity ratio:

Corresponding Metavir fibrosis score:  Some F3 + F4

Risk of fibrosis: High

Limitations of exam: None

Please note that abnormal shear wave velocities may also be
identified in clinical settings other than with hepatic fibrosis,
such as: acute hepatitis, elevated right heart and central venous
pressures including use of beta blockers, Lino disease
(Gerri), infiltrative processes such as
mastocytosis/amyloidosis/infiltrative tumor, extrahepatic
cholestasis, in the post-prandial state, and liver transplantation.
Correlation with patient history, laboratory data, and clinical
condition recommended.
IMPRESSION: ULTRASOUND ABDOMEN:

No evidence of cholelithiasis or biliary ductal dilatation.

Diffuse hepatocellular disease.

ULTRASOUND HEPATIC ELASTOGRAHY:

Median hepatic shear wave velocity is calculated at 3.08 m/sec.

Corresponding Metavir fibrosis score is Some F3 + F4.

Risk of fibrosis is High.

Follow-up: Follow up advised

## 2022-03-08 IMAGING — US US BIOPSY CORE LIVER
1 series · 10 of 10 positions shown · non-contrast
Comparison: Right upper quadrant abdominal ultrasound-10/17/2018

INDICATION: Elevated LFTs of uncertain etiology. Please perform
ultrasound-guided liver biopsy for tissue diagnostic purposes.

EXAM:
ULTRASOUND GUIDED LIVER BIOPSY

[Series 1: us biopsy (liver) · 10 acquisitions, 10 frames shown]
[im 1/10]
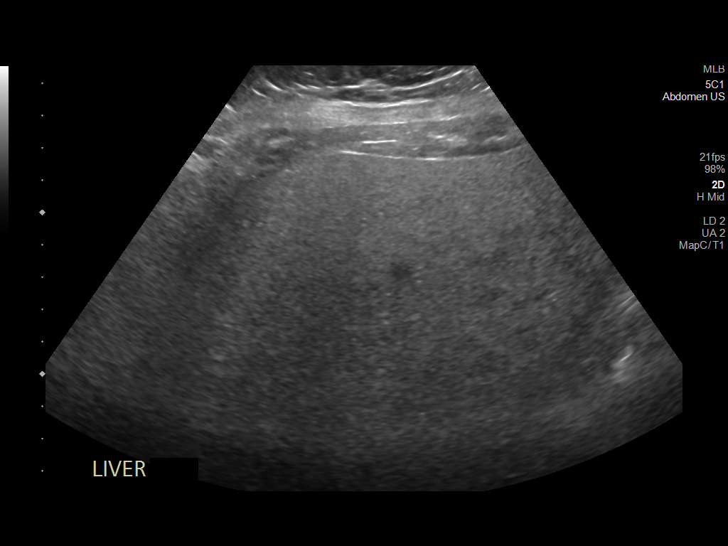
[im 2/10]
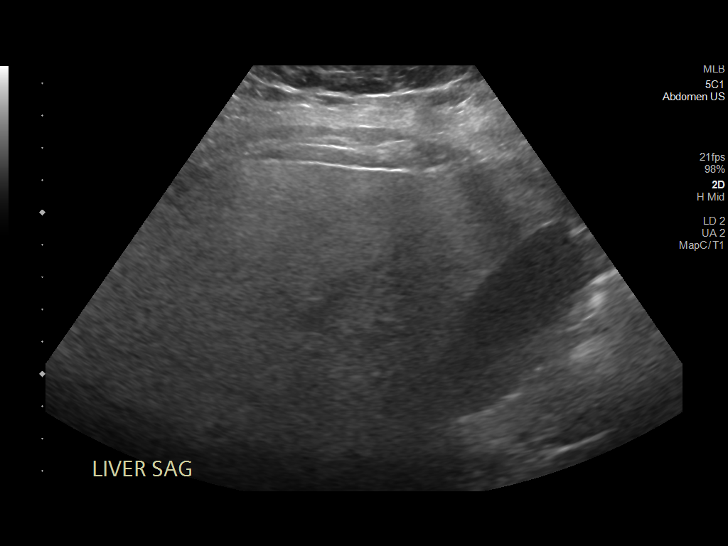
[im 3/10]
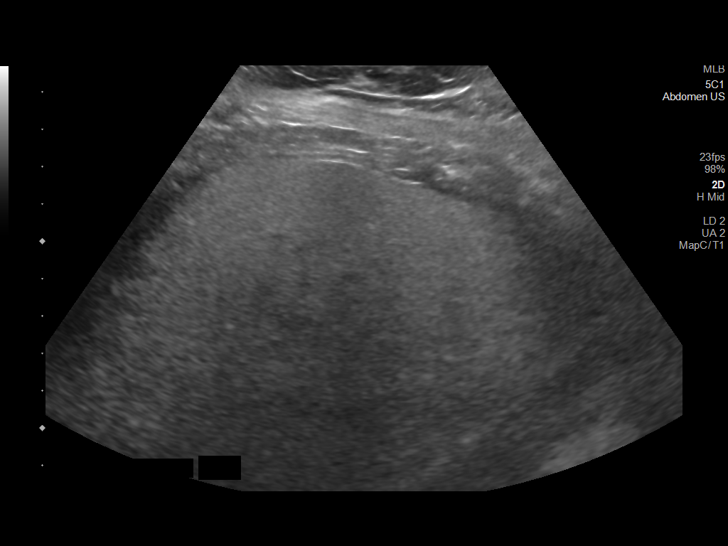
[im 4/10]
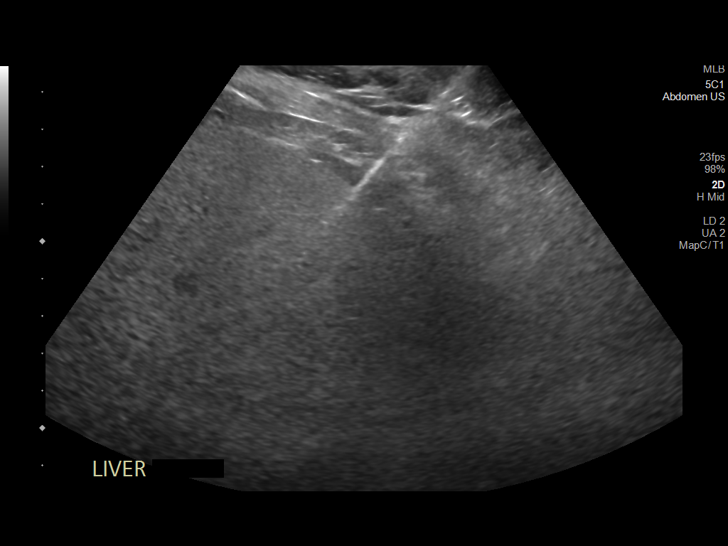
[im 5/10]
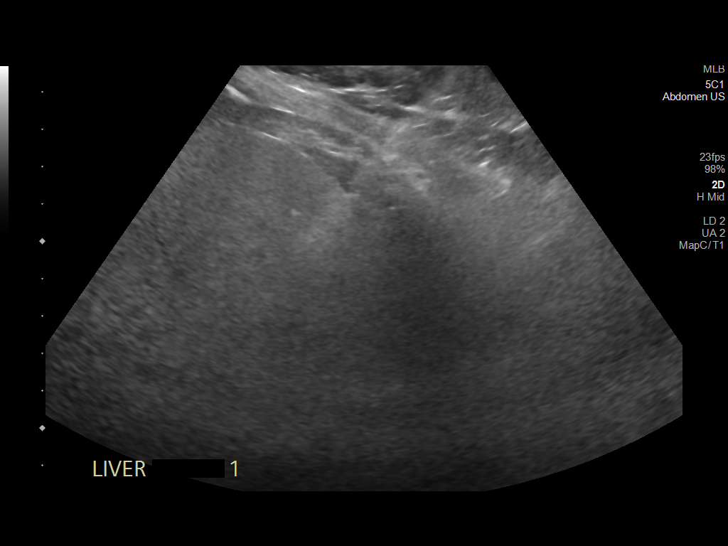
[im 6/10]
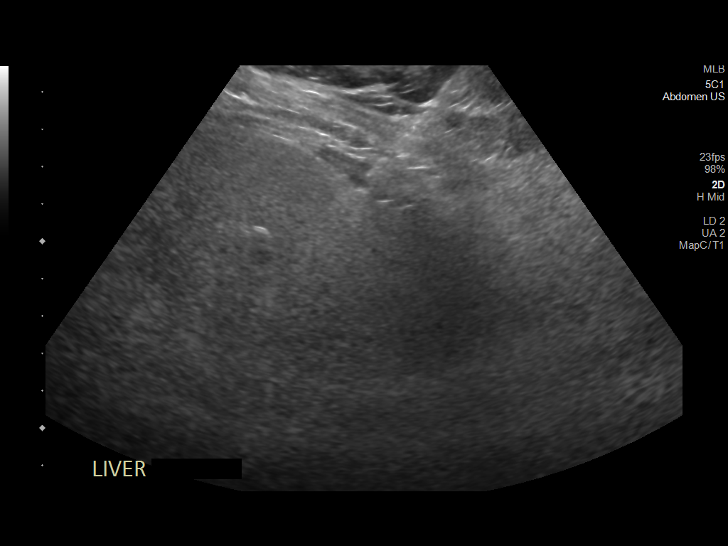
[im 7/10]
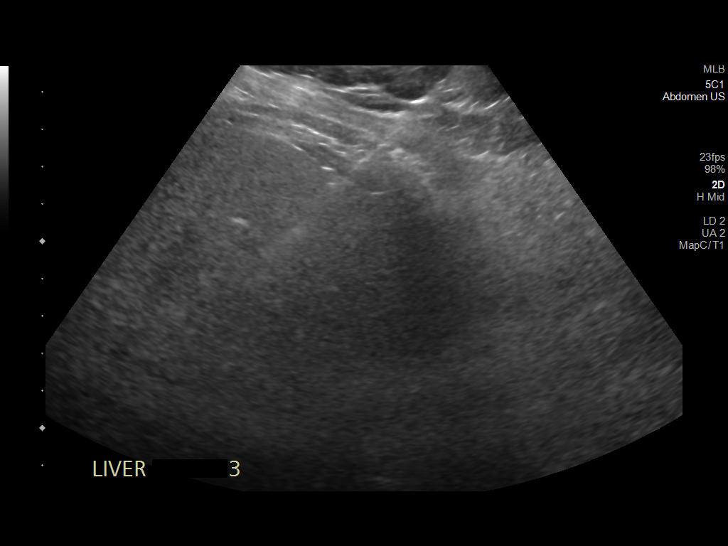
[im 8/10]
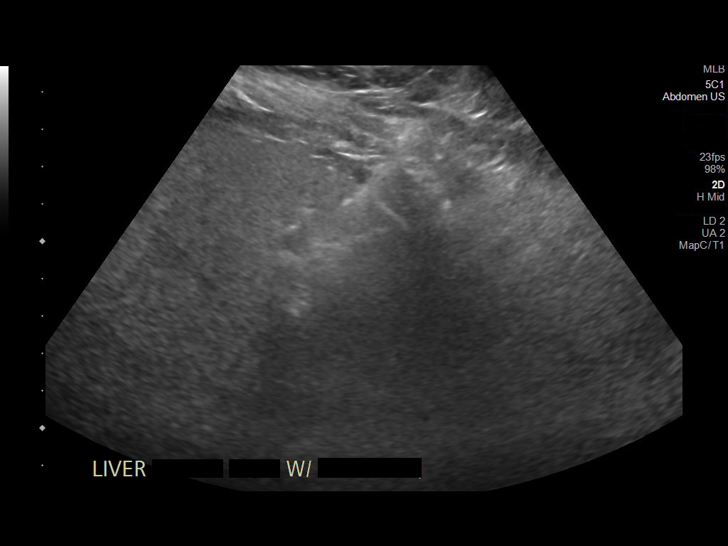
[im 9/10]
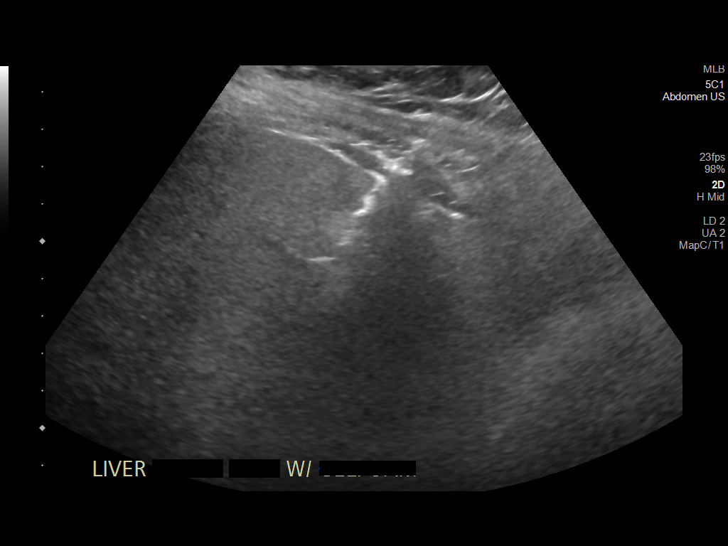
[im 10/10]
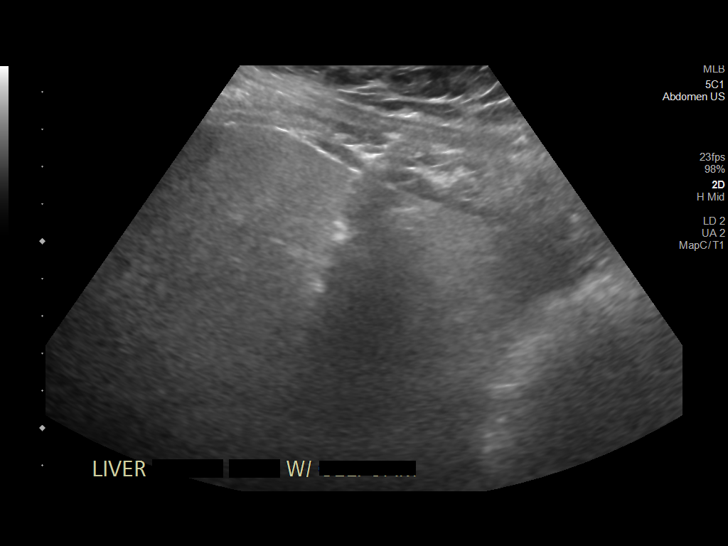

[10 of 10 positions shown; findings below may reference images not displayed]

MEDICATIONS:
None

ANESTHESIA/SEDATION:
Fentanyl 75 mcg IV; Versed 1.5 mg IV

Total Moderate Sedation time: 11 minutes; The patient was
continuously monitored during the procedure by the interventional
radiology nurse under my direct supervision.

COMPLICATIONS:
None immediate.

PROCEDURE:
Informed written consent was obtained from the patient and the
patient's mother after a discussion of the risks, benefits and
alternatives to treatment. The patient understands and consents the
procedure. A timeout was performed prior to the initiation of the
procedure.

Ultrasound scanning was performed of the right upper abdominal
quadrant and the procedure was planned. The right upper abdomen was
prepped and draped in the usual sterile fashion. The overlying soft
tissues were anesthetized with 1% lidocaine with epinephrine. A 17
gauge, 6.8 cm co-axial needle was advanced into a peripheral aspect
of the right lobe of the liver and 3 core biopsies were obtained
with an 18 gauge core device under direct ultrasound guidance.

The co-axial needle track was embolized with the administration of a
Gel-Foam slurry. Superficial hemostasis was obtained with manual
compression. Post procedural scanning was negative for definitive
area of hemorrhage. A dressing was placed. The patient tolerated the
procedure well without immediate post procedural complication.
IMPRESSION: Technically successful ultrasound guided liver biopsy.

## 2022-07-12 ENCOUNTER — Emergency Department (HOSPITAL_BASED_OUTPATIENT_CLINIC_OR_DEPARTMENT_OTHER): Payer: BC Managed Care – PPO | Admitting: Radiology

## 2022-07-12 ENCOUNTER — Emergency Department (HOSPITAL_BASED_OUTPATIENT_CLINIC_OR_DEPARTMENT_OTHER)
Admission: EM | Admit: 2022-07-12 | Discharge: 2022-07-12 | Disposition: A | Discharge status: Home / Self Care | Payer: BC Managed Care – PPO | Attending: Emergency Medicine | Admitting: Emergency Medicine

## 2022-07-12 ENCOUNTER — Other Ambulatory Visit: Payer: Self-pay

## 2022-07-12 ENCOUNTER — Encounter (HOSPITAL_BASED_OUTPATIENT_CLINIC_OR_DEPARTMENT_OTHER): Payer: Self-pay | Admitting: Emergency Medicine

## 2022-07-12 DIAGNOSIS — F840 Autistic disorder: Secondary | ICD-10-CM | POA: Diagnosis not present

## 2022-07-12 DIAGNOSIS — R1310 Dysphagia, unspecified: Secondary | ICD-10-CM | POA: Diagnosis present

## 2022-07-12 DIAGNOSIS — R09A2 Foreign body sensation, throat: Secondary | ICD-10-CM | POA: Diagnosis not present

## 2022-07-12 MED ORDER — LIDOCAINE VISCOUS HCL 2 % MT SOLN
15.0000 mL | Freq: Once | OROMUCOSAL | Status: AC
  Administered 2022-07-12: 15 mL via OROMUCOSAL
  Filled 2022-07-12: qty 15

## 2022-07-12 MED ORDER — ALUM & MAG HYDROXIDE-SIMETH 200-200-20 MG/5ML PO SUSP
30.0000 mL | Freq: Once | ORAL | Status: AC
  Administered 2022-07-12: 30 mL via ORAL
  Filled 2022-07-12: qty 30

## 2022-07-12 NOTE — ED Notes (Signed)
Pt tolerating PO fluids

## 2022-07-12 NOTE — ED Triage Notes (Signed)
Patient from home w/ c/o feeling like a kernel of popcorn is stuck deep in his throat. States he ate popcorn within the last week. Denies difficulty breathing, does endorse some difficulty swallowing saliva. In no apparent distress, respirations even and unlabored in triage.

## 2022-07-12 NOTE — ED Provider Notes (Signed)
[facility] Emergency Department Provider Note MRN:  [id number]  Arrival date & time: 07/12/22     Chief Complaint   Dysphagia (Difficulty swallowing after feeling like a popcorn kernel is stuck in his throat)   History of Present Illness   Eugene Perry is a 27 y.o. year-old male with a history of autism, anxiety presenting to the ED with chief complaint of dysphagia.  Patient endorsing a globus sensation and pain with swallowing for the past week, getting much worse over the past day or 2.  Started after eating some popcorn.  Thinks he has something in his throat.  No shortness of breath, no other complaints.  Review of Systems  A thorough review of systems was obtained and all systems are negative except as noted in the HPI and PMH.   Patient's Health History    Past Medical History:  Diagnosis Date   Anxiety    Autism     History reviewed. No pertinent surgical history.  History reviewed. No pertinent family history.  Social History   Socioeconomic History   Marital status: Single    Spouse name: Not on file   Number of children: Not on file   Years of education: Not on file   Highest education level: Not on file  Occupational History   Not on file  Tobacco Use   Smoking status: Not on file   Smokeless tobacco: Not on file  Substance and Sexual Activity   Alcohol use: No   Drug use: No   Sexual activity: Not on file  Other Topics Concern   Not on file  Social History Narrative   Not on file   Social Determinants of Health   Financial Resource Strain: Not on file  Food Insecurity: Not on file  Transportation Needs: Not on file  Physical Activity: Not on file  Stress: Not on file  Social Connections: Not on file  Intimate Partner Violence: Not on file     Physical Exam   Vitals:   07/12/22 0247  BP: (!) 141/103  Pulse: (!) 103  Resp: 16  Temp: 98.5 F (36.9 C)  SpO2: 98%    CONSTITUTIONAL: Well-appearing,  NAD NEURO/PSYCH:  Alert and oriented x 3, no focal deficits EYES:  eyes equal and reactive ENT/NECK:  no LAD, no JVD CARDIO: Regular rate, well-perfused, normal S1 and S2 PULM:  CTAB no wheezing or rhonchi GI/GU:  non-distended, non-tender MSK/SPINE:  No gross deformities, no edema SKIN:  no rash, atraumatic   *Additional and/or pertinent findings included in MDM below  Diagnostic and Interventional Summary    EKG Interpretation  Date/Time:    Ventricular Rate:    PR Interval:    QRS Duration:   QT Interval:    QTC Calculation:   R Axis:     Text Interpretation:         Labs Reviewed - No data to display  DG Neck Soft Tissue  Final Result      Medications  alum & mag hydroxide-simeth (MAALOX/MYLANTA) 200-200-20 MG/5ML suspension 30 mL (30 mLs Oral Given 07/12/22 0327)  lidocaine (XYLOCAINE) 2 % viscous mouth solution 15 mL (15 mLs Mouth/Throat Given 07/12/22 0326)     Procedures  /  Critical Care Procedures  ED Course and Medical Decision Making  Initial Impression and Ddx Patient is in no acute distress with reassuring vital signs, no airway concerns, posterior oropharynx appears normal.  Past medical/surgical history that increases complexity of ED encounter: Anxiety,  autism  Interpretation of Diagnostics I personally reviewed the neck plain film and my interpretation is as follows: Normal    Patient Reassessment and Ultimate Disposition/Management     Patient appropriate for ENT follow-up if symptoms persist.  No emergent process.  Patient management required discussion with the following services or consulting groups:  None  Complexity of Problems Addressed Acute complicated illness or Injury  Additional Data Reviewed and Analyzed Further history obtained from: Further history from spouse/family member  Additional Factors Impacting ED Encounter Risk None  Elmer Sow. Pilar Plate, MD [facility] Emergency Medicine [facility]  Health [email or web address]  Final Clinical Impressions(s) / ED Diagnoses     ICD-10-CM   1. Globus sensation  R09.A2       ED Discharge Orders     None        Discharge Instructions Discussed with and Provided to Patient:     Discharge Instructions      You were evaluated in the Emergency Department and after careful evaluation, we did not find any emergent condition requiring admission or further testing in the hospital.  Your exam/testing today was overall reassuring.  Recommend follow-up with ENT for closer exam to see if you do have something in the back urethra.  Call the office number this morning.  Please return to the Emergency Department if you experience any worsening of your condition.  Thank you for allowing [place] to be a part of your care.        Sabas Sous, MD 07/12/22 [phone]

## 2022-07-12 NOTE — Discharge Instructions (Addendum)
You were evaluated in the Emergency Department and after careful evaluation, we did not find any emergent condition requiring admission or further testing in the hospital.  Your exam/testing today was overall reassuring.  Recommend follow-up with ENT for closer exam to see if you do have something in the back urethra.  Call the office number this morning.  Please return to the Emergency Department if you experience any worsening of your condition.  Thank you for allowing [place] to be a part of your care.

## 2022-07-12 NOTE — ED Notes (Signed)
Reviewed AVS with patient and mother, both expressed understanding of directions, deny further questions at this time.

## 2022-07-13 ENCOUNTER — Encounter (INDEPENDENT_AMBULATORY_CARE_PROVIDER_SITE_OTHER): Payer: Self-pay | Admitting: Otolaryngology

## 2022-07-13 ENCOUNTER — Ambulatory Visit (INDEPENDENT_AMBULATORY_CARE_PROVIDER_SITE_OTHER): Payer: BC Managed Care – PPO | Admitting: Otolaryngology

## 2022-07-13 VITALS — BP 124/82 | HR 105 | Ht 70.0 in | Wt 250.0 lb

## 2022-07-13 DIAGNOSIS — T17228A Food in pharynx causing other injury, initial encounter: Secondary | ICD-10-CM

## 2022-07-13 DIAGNOSIS — T17208A Unspecified foreign body in pharynx causing other injury, initial encounter: Secondary | ICD-10-CM

## 2022-07-13 NOTE — Progress Notes (Signed)
Eugene Perry is an 27 y.o. male.   Chief Complaint: popcorn in throat HPI: With a history of a popcorn feeling like it stuck in his throat.  This happened about a day or so ago.  He was seen in the emergency room.  He had the feeling immediately after eating popcorn.  The sensation is now for the most part gone.  Just a slight soreness at this point.  He has no difficulty getting any food and water down.  No swelling in his neck.  No breathing trouble.  Past Medical History:  Diagnosis Date   Anxiety    Autism     No past surgical history on file.  No family history on file. Social History:  reports that he has never smoked. He does not have any smokeless tobacco history on file. He reports that he does not drink alcohol and does not use drugs.  Allergies:  Allergies  Allergen Reactions   Latex     Skin gets red     (Not in a hospital admission)   No results found for this or any previous visit (from the past 48 hour(s)). DG Neck Soft Tissue  Result Date: 07/12/2022 CLINICAL DATA:  Question popcorn in the throat. Difficulty swallowing EXAM: NECK SOFT TISSUES - 1+ VIEW COMPARISON:  None Available. FINDINGS: There is no evidence of retropharyngeal soft tissue swelling or epiglottic enlargement. The cervical airway is unremarkable and no radio-opaque foreign body identified. Cervical spondylosis. IMPRESSION: Negative. Electronically Signed   By: Minerva Fester M.D.   On: 07/12/2022 03:58     Blood pressure 124/82, pulse (!) 105, height 5\' 10"  (1.778 m), weight 250 lb (113.4 kg), SpO2 93 %.  PHYSICAL EXAM: Appearance _ awake alert with no distress.  Head- atraumatic and no obvious abnormalities Eyes- PERRL, EOMI, no conjunctiva injection or ecchymosis Ears-  Right- Pinna without inflammation or swelling. canal without obstruction or injury. TM within normal limits  Left- Pinna without inflammation or swelling. canal without obstruction or injury. TM within normal limits Nose-  no lesions or masses.  Oc/OP- no lesions or excessive swelling. Mouth opening normal.  Hp/Larynx- normal voice and no airway issues. No swelling or lesions Neck- no mass or lesions. Normal movement.  Neuro- CNII-XII intact, no sensory deficits.  Lungs- normal effort no distress noted   Assessment/Plan Popcorn foreign body in the throat-this sounds like it is past.  Given its popcorn that should not present any worrisome issue.  He does have a reflux problem and will take Prilosec twice a day for about a week.  Right now his symptoms are almost completely resolved and within the next 24 to 48 hours he should have complete resolution.  He will follow-up if that is not the case.  We talked about a fiberoptic exam but do not feel like that is necessary at this time.  Suzanna Obey 07/13/2022, 9:35 AM

## 2022-08-30 ENCOUNTER — Other Ambulatory Visit: Payer: Self-pay

## 2022-08-30 ENCOUNTER — Emergency Department (HOSPITAL_BASED_OUTPATIENT_CLINIC_OR_DEPARTMENT_OTHER): Payer: BC Managed Care – PPO | Admitting: Radiology

## 2022-08-30 ENCOUNTER — Emergency Department (HOSPITAL_BASED_OUTPATIENT_CLINIC_OR_DEPARTMENT_OTHER)
Admission: EM | Admit: 2022-08-30 | Discharge: 2022-08-30 | Disposition: A | Discharge status: Home / Self Care | Payer: BC Managed Care – PPO | Attending: Emergency Medicine | Admitting: Emergency Medicine

## 2022-08-30 DIAGNOSIS — R0789 Other chest pain: Secondary | ICD-10-CM | POA: Insufficient documentation

## 2022-08-30 DIAGNOSIS — F840 Autistic disorder: Secondary | ICD-10-CM | POA: Insufficient documentation

## 2022-08-30 DIAGNOSIS — R079 Chest pain, unspecified: Secondary | ICD-10-CM

## 2022-08-30 DIAGNOSIS — Z91040 Latex allergy status: Secondary | ICD-10-CM | POA: Insufficient documentation

## 2022-08-30 LAB — BASIC METABOLIC PANEL
Anion gap: 9 (ref 5–15)
BUN: 14 mg/dL (ref 6–20)
CO2: 25 mmol/L (ref 22–32)
Calcium: 9.7 mg/dL (ref 8.9–10.3)
Chloride: 104 mmol/L (ref 98–111)
Creatinine, Ser: 0.83 mg/dL (ref 0.61–1.24)
GFR, Estimated: >60 mL/min (ref >60)
Glucose, Bld: 98 mg/dL (ref 70–99)
Potassium: 4.1 mmol/L (ref 3.5–5.1)
Sodium: 138 mmol/L (ref 135–145)

## 2022-08-30 LAB — CBC
HCT: 50.3 % (ref 39.0–52.0)
Hemoglobin: 17.3 g/dL — ABNORMAL HIGH (ref 13.0–17.0)
MCH: 29.7 pg (ref 26.0–34.0)
MCHC: 34.4 g/dL (ref 30.0–36.0)
MCV: 86.4 fL (ref 80.0–100.0)
Platelets: 450 10*3/uL — ABNORMAL HIGH (ref 150–400)
RBC: 5.82 MIL/uL — ABNORMAL HIGH (ref 4.22–5.81)
RDW: 13.4 % (ref 11.5–15.5)
WBC: 8.6 10*3/uL (ref 4.0–10.5)
nRBC: 0 % (ref 0.0–0.2)

## 2022-08-30 LAB — TROPONIN I (HIGH SENSITIVITY): Troponin I (High Sensitivity): 2 ng/L (ref <18)

## 2022-08-30 NOTE — ED Triage Notes (Signed)
Right sided chest pains. Started this AM  Relieved with tums and sleep then returned this afternoon. Denies n/v/ no sob no dizziness

## 2022-08-30 NOTE — ED Provider Notes (Signed)
[facility] EMERGENCY DEPARTMENT AT [facility] Provider Note   CSN: [id number] Arrival date & time: 08/30/22  1851     History  Chief Complaint  Patient presents with   Chest Pain    Eugene Perry is a 27 y.o. male.   Chest Pain Patient presents for chest pain.  Medical history includes anxiety, autism.  This morning, patient had an episode of right-sided chest pain.  It was improved with Tums.  This afternoon, after he ate lunch, he had recurrence of right-sided chest pain.  Area did seem sore and tight to him.  Pain was worsened with palpation.  While in the ED waiting room, patient had resolution of symptoms.  He currently denies any symptoms.  He does not smoke.  He does not have first-degree relatives who had early ACS.     Home Medications Prior to Admission medications   Medication Sig Start Date End Date Taking? Authorizing Provider  acetaminophen (TYLENOL) 160 MG/5ML suspension Take 960 mg by mouth every 6 (six) hours as needed for mild pain or fever. 30 ml  Patient not taking: Reported on 07/13/2022    [provider]  ALPRAZolam Prudy Feeler) 0.5 MG tablet Take 0.25 mg by mouth daily as needed for anxiety.     [provider]  cetirizine HCl (ZYRTEC) 5 MG/5ML SOLN Take 10 mg by mouth daily.    [provider]  flintstones complete (FLINTSTONES) 60 MG chewable tablet Chew 1 tablet by mouth at bedtime.     [provider]  FLUoxetine (PROZAC) 20 MG/5ML solution Take 80 mg by mouth at bedtime. 4 tsp    [provider]  ketoconazole (NIZORAL) 2 % shampoo Apply 1 application topically once a week.    [provider]  Melatonin 5 MG CHEW Chew 5 mg by mouth at bedtime. Sublingual     [provider]  pseudoephedrine (SUDAFED) 30 MG/5ML syrup Take 120 mg by mouth daily as needed for congestion. 20 ml  Patient not taking: Reported on 07/13/2022    [provider]  triamcinolone cream (KENALOG) 0.1 % Apply  1 Application topically 2 (two) times daily.    [provider]      Allergies    Latex    Review of Systems   Review of Systems  Cardiovascular:  Positive for chest pain.  All other systems reviewed and are negative.   Physical Exam Updated Vital Signs BP (!) 149/90 (BP Location: Left Arm)   Pulse 95   Temp 98.7 F (37.1 C) (Oral)   Resp 16   SpO2 94%  Physical Exam Vitals and nursing note reviewed.  Constitutional:      General: He is not in acute distress.    Appearance: He is well-developed. He is not ill-appearing, toxic-appearing or diaphoretic.  HENT:     Head: Normocephalic and atraumatic.  Eyes:     Conjunctiva/sclera: Conjunctivae normal.  Cardiovascular:     Rate and Rhythm: Normal rate and regular rhythm.     Heart sounds: No murmur heard. Pulmonary:     Effort: Pulmonary effort is normal. No respiratory distress.     Breath sounds: Normal breath sounds. No wheezing, rhonchi or rales.  Chest:     Chest wall: Tenderness present.  Abdominal:     Palpations: Abdomen is soft.     Tenderness: There is no abdominal tenderness.  Musculoskeletal:        General: No swelling. Normal range of motion.  Cervical back: Normal range of motion and neck supple.     Right lower leg: No edema.     Left lower leg: No edema.  Skin:    General: Skin is warm and dry.     Capillary Refill: Capillary refill takes less than 2 seconds.     Coloration: Skin is not cyanotic or pale.  Neurological:     General: No focal deficit present.     Mental Status: He is alert and oriented to person, place, and time.  Psychiatric:        Mood and Affect: Mood normal.        Behavior: Behavior normal.     ED Results / Procedures / Treatments   Labs (all labs ordered are listed, but only abnormal results are displayed) Labs Reviewed  CBC - Abnormal; Notable for the following components:      Result Value   RBC 5.82 (*)    Hemoglobin 17.3 (*)    Platelets 450 (*)     All other components within normal limits  BASIC METABOLIC PANEL  TROPONIN I (HIGH SENSITIVITY)    EKG EKG Interpretation Date/Time:  Tuesday August 30 2022 19:41:36 EDT Ventricular Rate:  99 PR Interval:  138 QRS Duration:  78 QT Interval:  340 QTC Calculation: 436 R Axis:   52  Text Interpretation: Normal sinus rhythm Confirmed by Gloris Manchester [phone]) on 08/30/2022 9:51:37 PM  Radiology DG Chest 2 View  Result Date: 08/30/2022 CLINICAL DATA:  Chest pain. EXAM: CHEST - 2 VIEW COMPARISON:  None Available. FINDINGS: The heart size and mediastinal contours are within normal limits. Both lungs are clear. The visualized skeletal structures are unremarkable. IMPRESSION: No active cardiopulmonary disease. Electronically Signed   By: Elgie Collard M.D.   On: 08/30/2022 20:13    Procedures Procedures    Medications Ordered in ED Medications - No data to display  ED Course/ Medical Decision Making/ A&P                                 Medical Decision Making Amount and/or Complexity of Data Reviewed Labs: ordered. Radiology: ordered.   Patient presents for transient episode of right-sided chest pain today.  These were after eating and improved with Tums.  They he is likely a reflux component.  He also endorses some soreness to the area and he does have tenderness to the right side of his chest on palpation.  This suggest musculoskeletal etiology.  Prior to being bedded in the ED, workup was initiated.  X-ray, EKG, lab work, including troponins are all reassuring.  Patient is well-appearing on exam.  He has had resolution of all his symptoms.  He was given reassurance and discharged in good condition.        Final Clinical Impression(s) / ED Diagnoses Final diagnoses:  Right-sided chest pain    Rx / DC Orders ED Discharge Orders     None         Gloris Manchester, MD 08/30/22 2202

## 2022-08-30 NOTE — Discharge Instructions (Signed)
Test results today are all reassuring.  Take over-the-counter Tums and Maalox as needed for reflux symptoms.  Take ibuprofen for muscle pain.  Return to the emergency department for any new or worsening symptoms of concern.

## 2022-09-06 ENCOUNTER — Emergency Department (HOSPITAL_BASED_OUTPATIENT_CLINIC_OR_DEPARTMENT_OTHER)
Admission: EM | Admit: 2022-09-06 | Discharge: 2022-09-06 | Disposition: A | Discharge status: Home / Self Care | Payer: BC Managed Care – PPO | Attending: Emergency Medicine | Admitting: Emergency Medicine

## 2022-09-06 ENCOUNTER — Encounter (HOSPITAL_BASED_OUTPATIENT_CLINIC_OR_DEPARTMENT_OTHER): Payer: Self-pay

## 2022-09-06 ENCOUNTER — Other Ambulatory Visit: Payer: Self-pay

## 2022-09-06 ENCOUNTER — Other Ambulatory Visit (HOSPITAL_BASED_OUTPATIENT_CLINIC_OR_DEPARTMENT_OTHER): Payer: Self-pay

## 2022-09-06 ENCOUNTER — Emergency Department (HOSPITAL_BASED_OUTPATIENT_CLINIC_OR_DEPARTMENT_OTHER): Payer: BC Managed Care – PPO | Admitting: Radiology

## 2022-09-06 DIAGNOSIS — Z91040 Latex allergy status: Secondary | ICD-10-CM | POA: Diagnosis not present

## 2022-09-06 DIAGNOSIS — F840 Autistic disorder: Secondary | ICD-10-CM | POA: Diagnosis not present

## 2022-09-06 DIAGNOSIS — R072 Precordial pain: Secondary | ICD-10-CM | POA: Insufficient documentation

## 2022-09-06 DIAGNOSIS — R079 Chest pain, unspecified: Secondary | ICD-10-CM

## 2022-09-06 LAB — CBC
HCT: 49.6 % (ref 39.0–52.0)
Hemoglobin: 17 g/dL (ref 13.0–17.0)
MCH: 29.2 pg (ref 26.0–34.0)
MCHC: 34.3 g/dL (ref 30.0–36.0)
MCV: 85.2 fL (ref 80.0–100.0)
Platelets: 470 10*3/uL — ABNORMAL HIGH (ref 150–400)
RBC: 5.82 MIL/uL — ABNORMAL HIGH (ref 4.22–5.81)
RDW: 13.2 % (ref 11.5–15.5)
WBC: 8.3 10*3/uL (ref 4.0–10.5)
nRBC: 0 % (ref 0.0–0.2)

## 2022-09-06 LAB — TROPONIN I (HIGH SENSITIVITY): Troponin I (High Sensitivity): <2 ng/L (ref <18)

## 2022-09-06 LAB — BASIC METABOLIC PANEL
Anion gap: 9 (ref 5–15)
BUN: 14 mg/dL (ref 6–20)
CO2: 23 mmol/L (ref 22–32)
Calcium: 9 mg/dL (ref 8.9–10.3)
Chloride: 106 mmol/L (ref 98–111)
Creatinine, Ser: 0.75 mg/dL (ref 0.61–1.24)
GFR, Estimated: >60 mL/min (ref >60)
Glucose, Bld: 97 mg/dL (ref 70–99)
Potassium: 4.1 mmol/L (ref 3.5–5.1)
Sodium: 138 mmol/L (ref 135–145)

## 2022-09-06 LAB — D-DIMER, QUANTITATIVE: D-Dimer, Quant: <0.27 ug/mL-FEU (ref 0.00–0.50)

## 2022-09-06 MED ORDER — ALUM & MAG HYDROXIDE-SIMETH 200-200-20 MG/5ML PO SUSP
30.0000 mL | Freq: Once | ORAL | Status: AC
  Administered 2022-09-06: 30 mL via ORAL
  Filled 2022-09-06: qty 30

## 2022-09-06 MED ORDER — KETOROLAC TROMETHAMINE 15 MG/ML IJ SOLN
15.0000 mg | Freq: Once | INTRAMUSCULAR | Status: AC
  Administered 2022-09-06: 15 mg via INTRAVENOUS
  Filled 2022-09-06: qty 1

## 2022-09-06 NOTE — Discharge Instructions (Addendum)
Evaluation today was overall reassuring.  Suspect her chest pain is related to possible reflux.  Recommend he continue taking omeprazole.  Please follow-up with your PCP.  With regards to your numbness, if it returns, if you develop headache, visual disturbance, facial droop, gait changes, weakness or numbness in your extremities or any other concern please return emergency department further evaluation.

## 2022-09-06 NOTE — ED Provider Notes (Signed)
[place] EMERGENCY DEPARTMENT AT [facility] Provider Note   CSN: [id number] Arrival date & time: 09/06/22  1500     History  No chief complaint on file.  HPI Eugene Perry is a 27 y.o. male with anxiety and autism presenting for chest pain.  Started 11 AM this morning.  Said the center of his chest and radiates down his left arm.  Also states that the left side of his forehead felt numb.  Denies associated shortness of breath.  Chest pain is nonexertional and nonpleuritic.  Denies headache, visual disturbance or dizziness.  States the pain did seem to be worse after he ate lunch as well but then eased off.  States he has had this pain before and was advised by a medical provider that it was likely reflux.  Trauma to his head.  HPI     Home Medications Prior to Admission medications   Medication Sig Start Date End Date Taking? Authorizing Provider  ALPRAZolam Prudy Feeler) 0.5 MG tablet Take 0.25 mg by mouth daily as needed for anxiety.     [provider]  cetirizine HCl (ZYRTEC) 5 MG/5ML SOLN Take 10 mg by mouth daily.    [provider]  flintstones complete (FLINTSTONES) 60 MG chewable tablet Chew 1 tablet by mouth at bedtime.     [provider]  FLUoxetine (PROZAC) 20 MG/5ML solution Take 80 mg by mouth at bedtime. 4 tsp    [provider]  ketoconazole (NIZORAL) 2 % shampoo Apply 1 application topically once a week.    [provider]  Melatonin 5 MG CHEW Chew 5 mg by mouth at bedtime. Sublingual     [provider]  triamcinolone cream (KENALOG) 0.1 % Apply 1 Application topically 2 (two) times daily.    [provider]      Allergies    Latex    Review of Systems   See HPI for pertinent positives   Physical Exam   Vitals:   09/06/22 1600 09/06/22 1700  BP: (!) 137/94 139/88  Pulse: 98 100  Resp: 15 (!) 21  Temp:    SpO2: 98% 96%    CONSTITUTIONAL:  well-appearing, NAD NEURO:  GCS 15. Speech  is goal oriented. No deficits appreciated to CN III-XII; symmetric eyebrow raise, no facial drooping, tongue midline. Patient has equal grip strength bilaterally with 5/5 strength against resistance in all major muscle groups bilaterally. Sensation to light touch intact. Patient moves extremities without ataxia. Normal finger-nose-finger. Patient ambulatory with steady gait. EYES:  eyes equal and reactive ENT/NECK:  Supple, no stridor Chest: TTP to midsternal chest CARDIO:  Tachycardic and regular rhythm, appears well-perfused  PULM:  No respiratory distress, CTAB GI/GU:  non-distended, soft MSK/SPINE:  No gross deformities, no edema, moves all extremities  SKIN:  no rash, atraumatic   *Additional and/or pertinent findings included in MDM below    ED Results / Procedures / Treatments   Labs (all labs ordered are listed, but only abnormal results are displayed) Labs Reviewed  CBC - Abnormal; Notable for the following components:      Result Value   RBC 5.82 (*)    Platelets 470 (*)    All other components within normal limits  BASIC METABOLIC PANEL  D-DIMER, QUANTITATIVE  TROPONIN I (HIGH SENSITIVITY)  TROPONIN I (HIGH SENSITIVITY)    EKG None  Radiology DG Chest 2 View  Result Date: 09/06/2022 CLINICAL DATA:  Chest pain EXAM: CHEST - 2 VIEW COMPARISON:  X-ray  08/30/2018 FINDINGS: The heart size and mediastinal contours are within normal limits. Both lungs are clear. No consolidation, pneumothorax or effusion. No edema. The visualized skeletal structures are unremarkable. IMPRESSION: No acute cardiopulmonary disease. Electronically Signed   By: Karen Kays M.D.   On: 09/06/2022 16:08    Procedures Procedures    Medications Ordered in ED Medications  ketorolac (TORADOL) 15 MG/ML injection 15 mg (15 mg Intravenous Given 09/06/22 1621)  alum & mag hydroxide-simeth (MAALOX/MYLANTA) 200-200-20 MG/5ML suspension 30 mL (30 mLs Oral Given 09/06/22 1624)    ED Course/ Medical  Decision Making/ A&P                                 Medical Decision Making Amount and/or Complexity of Data Reviewed Labs: ordered. Radiology: ordered.  Risk OTC drugs. Prescription drug management.   27 year old well-appearing male presenting for chest pain, left arm and left forehead numbness.  Exam is unremarkable.  DDx includes ACS, PE, stroke, reflux, MSK pain.  Doubt stroke given no focal neurodeficits and his numbness resolved throughout encounter.  Workup does not suggest ACS or PE.  Chest pain was producible.  Could be MSK pain.  Also chest pain was reported to be worse after meals and patient does endorse a history of reflux.  Concerned this may be contributing as well.  His mother did state he is taking omeprazole at home.  His symptoms did improve after Toradol and GI cocktail. Advised him to follow-up with PCP and continue to take omeprazole.  Discussed pertinent return precautions.  Vital stable.  Discharged home.        Final Clinical Impression(s) / ED Diagnoses Final diagnoses:  None    Rx / DC Orders ED Discharge Orders     None         Gareth Eagle, PA-C 09/06/22 1726    Glyn Ade, MD 09/06/22 [phone]

## 2022-09-06 NOTE — ED Triage Notes (Signed)
Pt c/o midsternal CP, "numbness/ tingling around arms/ legs & head- little more on the L than R." 6/10 tightness in chest. No hx
